# Patient Record
Sex: Female | Born: 2000 | Hispanic: No | Marital: Single | Smoking: Never smoker
Health system: Southern US, Community
[De-identification: ages and names within clinical notes are randomized; demographics above are authoritative.]

## PROBLEM LIST (undated history)

## (undated) DIAGNOSIS — Z789 Other specified health status: Secondary | ICD-10-CM

## (undated) DIAGNOSIS — Z0282 Encounter for adoption services: Secondary | ICD-10-CM

## (undated) DIAGNOSIS — D649 Anemia, unspecified: Secondary | ICD-10-CM

## (undated) DIAGNOSIS — H579 Unspecified disorder of eye and adnexa: Secondary | ICD-10-CM

## (undated) DIAGNOSIS — Z201 Contact with and (suspected) exposure to tuberculosis: Secondary | ICD-10-CM

## (undated) DIAGNOSIS — F32A Depression, unspecified: Secondary | ICD-10-CM

## (undated) HISTORY — DX: Depression, unspecified: F32.A

## (undated) HISTORY — DX: Encounter for adoption services: Z02.82

## (undated) HISTORY — DX: Contact with and (suspected) exposure to tuberculosis: Z20.1

## (undated) HISTORY — DX: Other specified health status: Z78.9

## (undated) HISTORY — DX: Anemia, unspecified: D64.9

## (undated) HISTORY — DX: Unspecified disorder of eye and adnexa: H57.9

---
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
## 2020-07-10 ENCOUNTER — Telehealth (INDEPENDENT_AMBULATORY_CARE_PROVIDER_SITE_OTHER): Payer: Managed Care, Other (non HMO) | Admitting: Family

## 2020-07-10 ENCOUNTER — Encounter: Payer: Self-pay | Admitting: Family

## 2020-07-10 DIAGNOSIS — R4589 Other symptoms and signs involving emotional state: Secondary | ICD-10-CM

## 2020-07-10 DIAGNOSIS — R41840 Attention and concentration deficit: Secondary | ICD-10-CM | POA: Diagnosis not present

## 2020-07-10 DIAGNOSIS — F4323 Adjustment disorder with mixed anxiety and depressed mood: Secondary | ICD-10-CM | POA: Diagnosis not present

## 2020-07-10 MED ORDER — FLUOXETINE HCL 20 MG PO CAPS: 20.0000 mg | ORAL_CAPSULE | Freq: Every day | ORAL | 0 refills | Status: AC

## 2020-07-10 NOTE — Progress Notes (Signed)
THIS RECORD MAY CONTAIN CONFIDENTIAL INFORMATION THAT SHOULD NOT BE RELEASED WITHOUT REVIEW OF THE SERVICE PROVIDER.  Virtual Follow-Up Visit via Video Note  I connected with Brittany Hancock  on 07/10/20 at  2:00 PM EST by a video enabled telemedicine application and verified that I am speaking with the correct person using two identifiers.   Patient/parent location: college   I discussed the limitations of evaluation and management by telemedicine and the availability of in person appointments.  I discussed that the purpose of this telehealth visit is to provide medical care while limiting exposure to the novel coronavirus.  The patient expressed understanding and agreed to proceed.   Brittany Hancock is a 19 y.o. female referred by No ref. provider found here today for follow-up of adjustment disorder with mixed anxiety and depressed mood, ADHD, inattention.    History was provided by the patient.  Plan from Last Visit:   -previously was on flouxetine 40 mg   Chief Complaint: -increased anxiety  -low motivation  -fidgeting/trouble focusing   History of Present Illness:  -right now Fall Semester Fall Semester [employer] [facility] [employer] [facility] [facility]  -questions whether she should restart fluoxetine; stopped taking it some time ago and now feels anxious again  -no SI/HI  -also having trouble focusing on assignments and in class -notices that she fidgets a lot  -friend was recently diagnosed and endorsed that Brittany Hancock has a lot of the same symptoms  -would be interested in testing accomodations if she is diagnosed with ADD or ADHD   Review of Systems  Constitutional: Negative for chills, fever and malaise/fatigue.  HENT: Negative for sore throat.   Eyes: Negative for blurred vision and pain.  Respiratory: Negative for shortness of breath.   Cardiovascular: Negative for chest pain and palpitations.  Gastrointestinal: Negative for abdominal pain and nausea.  Genitourinary: Negative for dysuria.   Musculoskeletal: Negative for joint pain and myalgias.  Skin: Negative for rash.  Neurological: Negative for dizziness and headaches.  Psychiatric/Behavioral: Negative for suicidal ideas. The patient is nervous/anxious.     No Known Allergies Outpatient Medications Prior to Visit  Medication Sig Dispense Refill  . cyclobenzaprine (FLEXERIL) 10 MG tablet Take 1 tablet (10 mg total) approximately 4 hours before your scheduled procedure. 1 tablet 0  . ferrous sulfate 325 (65 FE) MG tablet Take 1 tablet (325 mg total) by mouth daily with breakfast. 60 tablet 3  . Fish Oil-Cholecalciferol (FISH OIL + D3 PO) Take by mouth.    Marland Kitchen FLUoxetine (PROZAC) 40 MG capsule Take 1 capsule (40 mg total) by mouth daily. (Patient not taking: Reported on 06/25/2018) 30 capsule 5  . Multiple Vitamins-Iron (DAILY-VITAMIN/IRON PO) Take by mouth. Reported on 10/18/2015    . norethindrone-ethinyl estradiol (LARIN FE 1/20) 1-20 MG-MCG tablet Take 1 tablet by mouth daily. 28 tablet 10   No facility-administered medications prior to visit.     Patient Active Problem List   Diagnosis Date Noted  . Unsuccessful attempt to insert intrauterine device (IUD) 09/15/2018  . Menorrhagia with regular cycle 04/29/2014   The following portions of the patient's history were reviewed and updated as appropriate: allergies, current medications, past family history, past medical history, past social history, past surgical history and problem list.  Visual Observations/Objective:   General Appearance: Well nourished well developed, in no apparent distress.  Eyes: conjunctiva no swelling or erythema ENT/Mouth: No hoarseness, No cough for duration of visit.  Neck: Supple  Respiratory: Respiratory effort normal, normal rate, no retractions or distress.  Cardio: Appears well-perfused, noncyanotic Musculoskeletal: no obvious deformity Skin: visible skin without rashes, ecchymosis, erythema Neuro: Awake and oriented X 3,  Psych:   normal affect, Insight and Judgment appropriate.    Assessment/Plan: 19 yo A/I female presents with concerns of anxiety and depressive symptoms and issues with focus; she was previously on fluoxetine 40 mg with benefit.  Reviewed PHQSADS and ASRS results; both are positive.  Discussed that anxiety and inattention/focus issues often have similar presentations, however she would benefit from further diagnostics as her ASRS was very elevated. Fluoxteine 20 mg restart and BH referral for further assessment of ADHD.    1. Adjustment disorder with mixed anxiety and depressed mood -restart fluoxetine 20 mg today  -referral to [facility] for DIVA for diagnostic  - Amb ref to Integrated Behavioral [facility]  2. Inattention 3. Fidgeting - Amb ref to Integrated Behavioral Health   BH screenings:  PHQ-SADS Last 3 Score only 07/10/2020  PHQ-15 Score 6  Total GAD-7 Score 14  PHQ-9 Total Score 22    ASRS total 41    Screens discussed with patient and parent and adjustments to plan made accordingly.   I discussed the assessment and treatment plan with the patient and/or parent/guardian.  They were provided an opportunity to ask questions and all were answered.  They agreed with the plan and demonstrated an understanding of the instructions. They were advised to call back or seek an in-person evaluation in the emergency room if the symptoms worsen or if the condition fails to improve as anticipated.   Follow-up:   2 weeks - BH between now and then for DIVA-2  Medical decision-making:   I spent 30 minutes on this telehealth visit inclusive of face-to-face video and care coordination time I was located in office during this encounter.   Georges Mouse, NP    CC: Duard Brady, MD (Inactive), No ref. provider found

## 2020-07-25 ENCOUNTER — Telehealth: Payer: Self-pay | Admitting: Family

## 2020-09-05 ENCOUNTER — Other Ambulatory Visit: Payer: Self-pay

## 2020-10-19 ENCOUNTER — Telehealth: Payer: Self-pay

## 2020-10-19 ENCOUNTER — Other Ambulatory Visit: Payer: Self-pay | Admitting: Pediatrics

## 2020-10-19 MED ORDER — NORETHIN ACE-ETH ESTRAD-FE 1-20 MG-MCG PO TABS: 1.0000 | ORAL_TABLET | Freq: Every day | ORAL | 3 refills | Status: AC

## 2020-10-19 NOTE — Telephone Encounter (Signed)
Pt called requesting refill of birth control. norethindrone-ethinyl estradiol (LARIN FE 1/20) 1-20 MG-MCG tablet. Will run out this weekend.

## 2020-10-19 NOTE — Telephone Encounter (Signed)
Done

## 2020-10-19 NOTE — Telephone Encounter (Signed)
Spoke with patient and made her aware.
